# Patient Record
Sex: Female | Born: 1995 | Race: Black or African American | Hispanic: No | Marital: Single | Smoking: Current every day smoker
Health system: Southern US, Community
[De-identification: ages and names within clinical notes are randomized; demographics above are authoritative.]

## PROBLEM LIST (undated history)

## (undated) DIAGNOSIS — J45909 Unspecified asthma, uncomplicated: Secondary | ICD-10-CM

## (undated) DIAGNOSIS — E669 Obesity, unspecified: Secondary | ICD-10-CM

## (undated) HISTORY — PX: NO PAST SURGERIES: SHX2092

---
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
## 2015-06-21 ENCOUNTER — Emergency Department: Payer: Medicaid Other

## 2015-06-21 ENCOUNTER — Emergency Department
Admission: EM | Admit: 2015-06-21 | Discharge: 2015-06-21 | Disposition: A | Discharge status: Home / Self Care | Payer: Medicaid Other | Attending: Emergency Medicine | Admitting: Emergency Medicine

## 2015-06-21 ENCOUNTER — Encounter: Payer: Self-pay | Admitting: *Deleted

## 2015-06-21 DIAGNOSIS — T300 Burn of unspecified body region, unspecified degree: Secondary | ICD-10-CM

## 2015-06-21 DIAGNOSIS — T24111A Burn of first degree of right thigh, initial encounter: Secondary | ICD-10-CM | POA: Insufficient documentation

## 2015-06-21 DIAGNOSIS — T24101A Burn of first degree of unspecified site of right lower limb, except ankle and foot, initial encounter: Secondary | ICD-10-CM | POA: Diagnosis not present

## 2015-06-21 DIAGNOSIS — T2111XA Burn of first degree of chest wall, initial encounter: Secondary | ICD-10-CM | POA: Insufficient documentation

## 2015-06-21 DIAGNOSIS — Z3202 Encounter for pregnancy test, result negative: Secondary | ICD-10-CM | POA: Insufficient documentation

## 2015-06-21 DIAGNOSIS — S0003XA Contusion of scalp, initial encounter: Secondary | ICD-10-CM

## 2015-06-21 DIAGNOSIS — X100XXA Contact with hot drinks, initial encounter: Secondary | ICD-10-CM | POA: Diagnosis not present

## 2015-06-21 DIAGNOSIS — Y9289 Other specified places as the place of occurrence of the external cause: Secondary | ICD-10-CM | POA: Diagnosis not present

## 2015-06-21 DIAGNOSIS — Y9389 Activity, other specified: Secondary | ICD-10-CM | POA: Insufficient documentation

## 2015-06-21 DIAGNOSIS — T2027XA Burn of second degree of neck, initial encounter: Secondary | ICD-10-CM | POA: Diagnosis not present

## 2015-06-21 DIAGNOSIS — S0990XA Unspecified injury of head, initial encounter: Secondary | ICD-10-CM | POA: Diagnosis present

## 2015-06-21 DIAGNOSIS — T22151A Burn of first degree of right shoulder, initial encounter: Secondary | ICD-10-CM | POA: Insufficient documentation

## 2015-06-21 DIAGNOSIS — Y998 Other external cause status: Secondary | ICD-10-CM | POA: Insufficient documentation

## 2015-06-21 HISTORY — DX: Unspecified asthma, uncomplicated: J45.909

## 2015-06-21 LAB — POCT PREGNANCY, URINE: Preg Test, Ur: NEGATIVE

## 2015-06-21 MED ORDER — SILVER SULFADIAZINE 1 % EX CREA
TOPICAL_CREAM | Freq: Once | CUTANEOUS | Status: AC
  Administered 2015-06-21: 1 via TOPICAL

## 2015-06-21 MED ORDER — HYDROCODONE-ACETAMINOPHEN 5-325 MG PO TABS: 1.0000 | ORAL_TABLET | ORAL | Status: DC | PRN

## 2015-06-21 MED ORDER — SILVER SULFADIAZINE 1 % EX CREA
TOPICAL_CREAM | CUTANEOUS | Status: AC
  Administered 2015-06-21: 1 via TOPICAL
  Filled 2015-06-21: qty 85

## 2015-06-21 MED ORDER — HYDROCODONE-ACETAMINOPHEN 5-325 MG PO TABS
ORAL_TABLET | ORAL | Status: AC
  Administered 2015-06-21: 1 via ORAL
  Filled 2015-06-21: qty 1

## 2015-06-21 MED ORDER — HYDROCODONE-ACETAMINOPHEN 5-325 MG PO TABS
1.0000 | ORAL_TABLET | Freq: Once | ORAL | Status: AC
  Administered 2015-06-21: 1 via ORAL
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
## 2015-06-21 NOTE — ED Notes (Signed)
Pt discharged home after verbalizing understanding of discharge instructions; nad noted. 

## 2015-06-21 NOTE — Discharge Instructions (Signed)
Burn Care Burns hurt your skin. When your skin is hurt, it is easier to get an infection. Follow your doctor's directions to help prevent an infection. HOME CARE  Wash your hands well before you change your bandage.  Change your bandage as often as told by your doctor.  Remove the old bandage. If the bandage sticks, soak it off with cool, clean water.  Gently clean the burn with mild soap and water.  Pat the burn dry with a clean, dry cloth.  Put a thin layer of medicated cream on the burn.  Put a clean bandage on as told by your doctor.  Keep the bandage clean and dry.  Raise (elevate) the burn for the first 24 hours. After that, follow your doctor's directions.  Only take medicine as told by your doctor. GET HELP RIGHT AWAY IF:   You have too much pain.  The skin near the burn is red, tender, puffy (swollen), or has red streaks.  The burn area has yellowish white fluid (pus) or a bad smell coming from it.  You have a fever. MAKE SURE YOU:   Understand these instructions.  Will watch your condition.  Will get help right away if you are not doing well or get worse. Document Released: 09/16/2008 Document Revised: 03/01/2012 Document Reviewed: 04/30/2011 [facility] Patient Information 2015 [employer], [place]. This information is not intended to replace advice given to you by your health care provider. Make sure you discuss any questions you have with your health care provider.    RETURN TO ER IF ANY SIGNS OF INFECTION TAKE NORCO FOR PAIN AS DIRECTED
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
## 2015-10-22 ENCOUNTER — Other Ambulatory Visit: Payer: Self-pay | Admitting: Internal Medicine

## 2015-10-22 ENCOUNTER — Encounter: Payer: Self-pay | Admitting: Internal Medicine

## 2015-10-22 DIAGNOSIS — F602 Antisocial personality disorder: Secondary | ICD-10-CM | POA: Insufficient documentation

## 2015-10-22 DIAGNOSIS — M26629 Arthralgia of temporomandibular joint, unspecified side: Secondary | ICD-10-CM | POA: Insufficient documentation

## 2015-10-22 DIAGNOSIS — F331 Major depressive disorder, recurrent, moderate: Secondary | ICD-10-CM | POA: Insufficient documentation

## 2016-05-25 ENCOUNTER — Emergency Department
Admission: EM | Admit: 2016-05-25 | Discharge: 2016-05-26 | Disposition: A | Discharge status: Home / Self Care | Payer: Medicaid Other | Attending: Emergency Medicine | Admitting: Emergency Medicine

## 2016-05-25 ENCOUNTER — Encounter: Payer: Self-pay | Admitting: Emergency Medicine

## 2016-05-25 DIAGNOSIS — F17210 Nicotine dependence, cigarettes, uncomplicated: Secondary | ICD-10-CM | POA: Diagnosis not present

## 2016-05-25 DIAGNOSIS — T40601A Poisoning by unspecified narcotics, accidental (unintentional), initial encounter: Secondary | ICD-10-CM | POA: Diagnosis not present

## 2016-05-25 DIAGNOSIS — J45909 Unspecified asthma, uncomplicated: Secondary | ICD-10-CM | POA: Diagnosis not present

## 2016-05-25 DIAGNOSIS — T50901A Poisoning by unspecified drugs, medicaments and biological substances, accidental (unintentional), initial encounter: Secondary | ICD-10-CM

## 2016-05-25 DIAGNOSIS — R05 Cough: Secondary | ICD-10-CM | POA: Diagnosis present

## 2016-05-25 NOTE — ED Notes (Addendum)
Pt says she's had a productive cough since yesterday; headache; denies fever; pt says just pta she drank what she thought was a bottle of "non-alcoholic Robitussin"; pt unsteady on her feet, chatty and giggly in triage; pt says she presently cannot feel her face

## 2016-05-26 LAB — URINALYSIS COMPLETE WITH MICROSCOPIC (ARMC ONLY)
Bacteria, UA: NONE SEEN
GLUCOSE, UA: NEGATIVE mg/dL
Hgb urine dipstick: NEGATIVE
Leukocytes, UA: NEGATIVE
Nitrite: NEGATIVE
Protein, ur: 30 mg/dL — AB
Specific Gravity, Urine: 1.033 — ABNORMAL HIGH (ref 1.005–1.030)
pH: 5 (ref 5.0–8.0)

## 2016-05-26 LAB — COMPREHENSIVE METABOLIC PANEL
ALT: 13 U/L — ABNORMAL LOW (ref 14–54)
ANION GAP: 6 (ref 5–15)
AST: 18 U/L (ref 15–41)
Albumin: 4.5 g/dL (ref 3.5–5.0)
Alkaline Phosphatase: 57 U/L (ref 38–126)
BILIRUBIN TOTAL: 0.4 mg/dL (ref 0.3–1.2)
BUN: 7 mg/dL (ref 6–20)
CO2: 24 mmol/L (ref 22–32)
Calcium: 9.2 mg/dL (ref 8.9–10.3)
Chloride: 110 mmol/L (ref 101–111)
Creatinine, Ser: 0.76 mg/dL (ref 0.44–1.00)
GFR calc non Af Amer: >60 mL/min (ref >60)
GLUCOSE: 96 mg/dL (ref 65–99)
Potassium: 4 mmol/L (ref 3.5–5.1)
SODIUM: 140 mmol/L (ref 135–145)
TOTAL PROTEIN: 8.1 g/dL (ref 6.5–8.1)

## 2016-05-26 LAB — CBC WITH DIFFERENTIAL/PLATELET
Basophils Absolute: 0.1 10*3/uL (ref 0–0.1)
EOS ABS: 0.1 10*3/uL (ref 0–0.7)
Eosinophils Relative: 2 %
HEMATOCRIT: 41 % (ref 35.0–47.0)
Hemoglobin: 13.8 g/dL (ref 12.0–16.0)
Lymphocytes Relative: 15 %
Lymphs Abs: 1.1 10*3/uL (ref 1.0–3.6)
MCH: 31.1 pg (ref 26.0–34.0)
MCHC: 33.6 g/dL (ref 32.0–36.0)
MCV: 92.5 fL (ref 80.0–100.0)
MONO ABS: 0.7 10*3/uL (ref 0.2–0.9)
Neutro Abs: 5.3 10*3/uL (ref 1.4–6.5)
Platelets: 223 10*3/uL (ref 150–440)
RBC: 4.43 MIL/uL (ref 3.80–5.20)
RDW: 13.5 % (ref 11.5–14.5)
WBC: 7.2 10*3/uL (ref 3.6–11.0)

## 2016-05-26 LAB — SALICYLATE LEVEL: Salicylate Lvl: <4 mg/dL (ref 2.8–30.0)

## 2016-05-26 LAB — URINE DRUG SCREEN, QUALITATIVE (ARMC ONLY)
AMPHETAMINES, UR SCREEN: NOT DETECTED
Barbiturates, Ur Screen: NOT DETECTED
Benzodiazepine, Ur Scrn: NOT DETECTED
COCAINE METABOLITE, UR ~~LOC~~: NOT DETECTED
Cannabinoid 50 Ng, Ur ~~LOC~~: POSITIVE — AB
MDMA (ECSTASY) UR SCREEN: NOT DETECTED
METHADONE SCREEN, URINE: NOT DETECTED
Opiate, Ur Screen: POSITIVE — AB
PHENCYCLIDINE (PCP) UR S: POSITIVE — AB
Tricyclic, Ur Screen: NOT DETECTED

## 2016-05-26 LAB — ETHANOL

## 2016-05-26 LAB — ACETAMINOPHEN LEVEL
Acetaminophen (Tylenol), Serum: <10 ug/mL — ABNORMAL LOW (ref 10–30)
Acetaminophen (Tylenol), Serum: <10 ug/mL — ABNORMAL LOW (ref 10–30)

## 2016-05-26 LAB — POCT PREGNANCY, URINE: PREG TEST UR: NEGATIVE

## 2016-05-26 MED ORDER — LORAZEPAM 2 MG/ML IJ SOLN: 1.0000 mg | Freq: Once | INTRAMUSCULAR | Status: DC

## 2016-05-26 MED ORDER — LORAZEPAM 2 MG/ML IJ SOLN
1.0000 mg | Freq: Once | INTRAMUSCULAR | Status: AC
  Administered 2016-05-26: 1 mg via INTRAVENOUS
  Filled 2016-05-26: qty 1

## 2016-05-26 MED ORDER — SODIUM CHLORIDE 0.9 % IV BOLUS (SEPSIS)
1000.0000 mL | Freq: Once | INTRAVENOUS | Status: AC
  Administered 2016-05-26: 1000 mL via INTRAVENOUS

## 2016-05-26 NOTE — ED Provider Notes (Signed)
[facility] Emergency Department Provider Note   ____________________________________________  Time seen: Approximately 12:05 AM  I have reviewed the triage vital signs and the nursing notes.   HISTORY  Chief Complaint Headache and Cough    HPI Amber Stevens is a 20 y.o. female brought to the ED from work by a friend with a chief complaint of accidental overdose. Patient reports a productive cough since yesterday. States she celebrated her 20st birthday yesterday and is tired of being sick. While at work tonight, she drank an entire bottle of Robitussin. Denies intentional ingestion; states she just wanted to fix her cough. It is unclear what volume or type of Robitussin patient drank. She gestures describing a 4 ounce bottle of "nonalcoholic Robitussin". Also wanted her to be checked out secondary to patient being unsteady on her feet, acting drunk and bizarre. Denies recent fever, chills, chest pain, shortness of breath, abdominal pain, nausea, vomiting, diarrhea. Denies recent travel or trauma.   Past Medical History  Diagnosis Date  . Asthma     Patient Active Problem List   Diagnosis Date Noted  . Antisocial personality disorder 10/22/2015  . Moderate episode of recurrent major depressive disorder (HCC) 10/22/2015  . 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 No pertinent past surgical history.  No current outpatient prescriptions on file.  Allergies Review of patient's allergies indicates no known allergies.  Family History  Problem Relation Age of Onset  . CAD Father     Social History Social History  Substance Use Topics  . Smoking status: Current Every Day Smoker    Types: Cigarettes  . Smokeless tobacco: None  . Alcohol Use: No    Review of Systems  Constitutional: No fever/chills. Eyes: No visual changes. ENT: No sore throat. Cardiovascular: Denies chest pain. Respiratory:  Positive for cough.  Denies shortness of breath. Gastrointestinal: No abdominal pain.  No nausea, no vomiting.  No diarrhea.  No constipation. Genitourinary: Negative for dysuria. Musculoskeletal: Negative for back pain. Skin: Negative for rash. Neurological: Negative for headaches, focal weakness or numbness. Psychiatric:Positive for bizarre behavior and hallucinations.  10-point ROS otherwise negative.  ____________________________________________   PHYSICAL EXAM:  VITAL SIGNS: ED Triage Vitals  Enc Vitals Group     BP 05/25/16 2322 133/84 mmHg     Pulse Rate 05/25/16 2322 99     Resp 05/25/16 2322 18     Temp 05/25/16 2322 98.7 F (37.1 C)     Temp Source 05/25/16 2322 Oral     SpO2 05/25/16 2322 97 %     Weight 05/25/16 2322 280 lb (127.007 kg)     Height 05/25/16 2322 5\' 2"  (1.575 m)     Head Cir --      Peak Flow --      Pain Score 05/25/16 2338 0     Pain Loc --      Pain Edu? --      Excl. in GC? --     Constitutional: Alert and oriented. Well appearing and in mild acute distress. Feeling hot. Eyes: Conjunctivae are normal. PERRL. EOMI. Head: Atraumatic. Nose: No congestion/rhinnorhea. Mouth/Throat: Mucous membranes are moist.  Oropharynx non-erythematous. Neck: No stridor.  No cervical spine tenderness to palpation.  Supple neck without meningismus. Cardiovascular: Normal rate, regular rhythm. Grossly normal heart sounds.  Good peripheral circulation. Respiratory: Normal respiratory effort.  No retractions. Lungs CTAB. Gastrointestinal: Soft and nontender. No distention. No abdominal bruits. No CVA tenderness. Musculoskeletal: No lower  extremity tenderness nor edema.  No joint effusions. Neurologic:  Normal speech and language. No gross focal neurologic deficits are appreciated. MAEx4. Giddy and chatty. Skin:  Skin is warm, dry and intact. No rash noted. Psychiatric: Mood and affect are  bizarre Speech and behavior are bizarre. Seems to be responding to internal stimuli, looking  around the room, appears to be seeing things on the wall.  ____________________________________________   LABS (all labs ordered are listed, but only abnormal results are displayed)  Labs Reviewed  COMPREHENSIVE METABOLIC PANEL - Abnormal; Notable for the following:    ALT 13 (*)    All other components within normal limits  ACETAMINOPHEN LEVEL - Abnormal; Notable for the following:    Acetaminophen (Tylenol), Serum <10 (*)    All other components within normal limits  URINALYSIS COMPLETEWITH MICROSCOPIC ([facility] ONLY) - Abnormal; Notable for the following:    Color, Urine YELLOW (*)    APPearance CLEAR (*)    Bilirubin Urine 2+ (*)    Ketones, ur TRACE (*)    Specific Gravity, Urine 1.033 (*)    Protein, ur 30 (*)    Squamous Epithelial / LPF 0-5 (*)    All other components within normal limits  URINE DRUG SCREEN, QUALITATIVE ([facility] ONLY) - Abnormal; Notable for the following:    Opiate, Ur Screen POSITIVE (*)    Phencyclidine (PCP) Ur S POSITIVE (*)    Cannabinoid 50 Ng, Ur [place] POSITIVE (*)    All other components within normal limits  ACETAMINOPHEN LEVEL - Abnormal; Notable for the following:    Acetaminophen (Tylenol), Serum <10 (*)    All other components within normal limits  CBC WITH DIFFERENTIAL/PLATELET  ETHANOL  SALICYLATE LEVEL  SALICYLATE LEVEL  BLOOD GAS, ARTERIAL  POC URINE PREG, ED  POCT PREGNANCY, URINE   ____________________________________________  EKG  ED ECG REPORT I, Lekita Kerekes J, the attending physician, personally viewed and interpreted this ECG.   Date: 05/26/2016  EKG Time: 0005  Rate: 91  Rhythm: normal EKG, normal sinus rhythm  Axis: Normal  Intervals:none  ST&T Change: Nonspecific  ____________________________________________  RADIOLOGY  None ____________________________________________   PROCEDURES  Procedure(s) performed: None  Critical Care performed: No  ____________________________________________   INITIAL IMPRESSION  / ASSESSMENT AND PLAN / ED COURSE  Pertinent labs & imaging results that were available during my care of the patient were reviewed by me and considered in my medical decision making (see chart for details).  20 year old female who presents with accidental overdose of Robitussin. She is unsteady on her feet, giddy, hallucinating with bizarre behavior. Patient was placed under involuntary commitment secondary to her trying to leave the premises. Will obtain screening lab work including toxicological screen, initiate IV fluid resuscitation, Ativan for sedation. Nursing spoke with poison control who recommends repeat 4 hour Tylenol and salicylates as well as a minimum of 6 hour observation.  ----------------------------------------- 1:14 AM on 05/26/2016 -----------------------------------------  Coworker brought in what patient took. Tussin DM which contains dextromethorphan 20 mg and guaifenesin 200 mg in a 4 fluid ounce bottle.  ----------------------------------------- 3:45 AM on 05/26/2016 -----------------------------------------  Patient is resting in no acute distress. Normotensive and not tachycardic.  ----------------------------------------- 5:28 AM on 05/26/2016 -----------------------------------------  Patient is resting comfortably. Speech is less pressured. She is alert and oriented 3. Denies intentional ingestion. Denies depression, SI/HI/AH/VH. Educated patient on taking only the recommended amount of over-the-counter medicines. Strict return precautions given. Patient verbalizes understanding and agrees with plan of care. Will resend IVC and discharge  patient to home with responsible adult. ____________________________________________   FINAL CLINICAL IMPRESSION(S) / ED DIAGNOSES  Final diagnoses:  Accidental overdose, initial encounter      NEW MEDICATIONS STARTED DURING THIS VISIT:  New Prescriptions   No medications on file     Note:  This document was  prepared using Dragon voice recognition software and may include unintentional dictation errors.    Irean Hong, MD 05/26/16 [id number]

## 2016-05-26 NOTE — ED Notes (Signed)
Pt. States "I got bronchitis, I have gotten it before".  Pt. States "I needed something for my congestion and cough".  Pt. Stated she took some of her grandmothers medicine, pt. Unsure of what it was.  Pt. Stated she took two swallows of (Adult Tussin DM), then stated "I didn't think it was working so I drank the rest of 4 oz bottle.

## 2016-05-26 NOTE — ED Notes (Signed)
Poison control called to get lab values, EKG results and pt. Vitals.

## 2016-05-26 NOTE — ED Notes (Signed)
Pt. Friend left phone # to be called when pt. [place] [phone]

## 2016-05-26 NOTE — ED Notes (Signed)
Pt. Restless in room.  Pt. Does not want to stay in the bed, pt. Repeatedly has to be told to either stay in bed or sit on bench and to use call bell if she needs to get up.

## 2016-05-26 NOTE — ED Notes (Signed)
Pt. Going home with friend 

## 2016-05-26 NOTE — ED Notes (Signed)
Went into room to introduce self, pt. On phone.

## 2016-05-26 NOTE — Progress Notes (Signed)
Pt is refusing to allow me to stick for an ABG. Encouraged pt to allow stick and gave pt time to think about it. Pt continues to refuse to allow me to stick her. Dr. Dolores FrameSung notified.

## 2016-05-26 NOTE — Discharge Instructions (Signed)
Do not take more than the recommended amount of over-the-counter medication. Return to the ER for worsening symptoms, persistent vomiting, difficulty breathing or other concerns.  Accidental Overdose A drug overdose occurs when a chemical substance (drug or medication) is used in amounts large enough to overcome a person. This may result in severe illness or death. This is a type of poisoning. Accidental overdoses of medications or other substances come from a variety of reasons. When this happens accidentally, it is often because the person taking the substance does not know enough about what they have taken. Drugs which commonly cause overdose deaths are alcohol, psychotropic medications (medications which affect the mind), pain medications, illegal drugs (street drugs) such as cocaine and heroin, and multiple drugs taken at the same time. It may result from careless behavior (such as over-indulging at a party). Other causes of overdose may include multiple drug use, a lapse in memory, or drug use after a period of no drug use.  Sometimes overdosing occurs because a person cannot remember if they have taken their medication.  A common unintentional overdose in young children involves multi-vitamins containing iron. Iron is a part of the hemoglobin molecule in blood. It is used to transport oxygen to living cells. When taken in small amounts, iron allows the body to restock hemoglobin. In large amounts, it causes problems in the body. If this overdose is not treated, it can lead to death. Never take medicines that show signs of tampering or do not seem quite right. Never take medicines in the dark or in poor lighting. Read the label and check each dose of medicine before you take it. When adults are poisoned, it happens most often through carelessness or lack of information. Taking medicines in the dark or taking medicine prescribed for someone else to treat the same type of problem is a dangerous  practice. SYMPTOMS  Symptoms of overdose depend on the medication and amount taken. They can vary from over-activity with stimulant over-dosage, to sleepiness from depressants such as alcohol, narcotics and tranquilizers. Confusion, dizziness, nausea and vomiting may be present. If problems are severe enough coma and death may result. DIAGNOSIS  Diagnosis and management are generally straightforward if the drug is known. Otherwise it is more difficult. At times, certain symptoms and signs exhibited by the patient, or blood tests, can reveal the drug in question.  TREATMENT  In an emergency department, most patients can be treated with supportive measures. Antidotes may be available if there has been an overdose of opioids or benzodiazepines. A rapid improvement will often occur if this is the cause of overdose. At home or away from medical care:  There may be no immediate problems or warning signs in children.  Not everything works well in all cases of poisoning.  Take immediate action. Poisons may act quickly.  If you think someone has swallowed medicine or a household product, and the person is unconscious, having seizures (convulsions), or is not breathing, immediately call for an ambulance. IF a person is conscious and appears to be doing OK but has swallowed a poison:  Do not wait to see what effect the poison will have. Immediately call a poison control center (listed in the white pages of your telephone book under "Poison Control" or inside the front cover with other emergency numbers). Some poison control centers have TTY capability for the deaf. Check with your local center if you or someone in your family requires this service.  Keep the container so you can  read the label on the product for ingredients.  Describe what, when, and how much was taken and the age and condition of the person poisoned. Inform them if the person is vomiting, choking, drowsy, shows a change in color or  temperature of skin, is conscious or unconscious, or is convulsing.  Do not cause vomiting unless instructed by medical personnel. Do not induce vomiting or force liquids into a person who is convulsing, unconscious, or very drowsy. Stay calm and in control.   Activated charcoal also is sometimes used in certain types of poisoning and you may wish to add a supply to your emergency medicines. It is available without a prescription. Call a poison control center before using this medication. PREVENTION  Thousands of children die every year from unintentional poisoning. This may be from household chemicals, poisoning from carbon monoxide in a car, taking their parent's medications, or simply taking a few iron pills or vitamins with iron. Poisoning comes from unexpected sources.  Store medicines out of the sight and reach of children, preferably in a locked cabinet. Do not keep medications in a food cabinet. Always store your medicines in a secure place. Get rid of expired medications.  If you have children living with you or have them as occasional guests, you should have child-resistant caps on your medicine containers. Keep everything out of reach. Child proof your home.  If you are called to the telephone or to answer the door while you are taking a medicine, take the container with you or put the medicine out of the reach of small children.  Do not take your medication in front of children. Do not tell your child how good a medication is and how good it is for them. They may get the idea it is more of a treat.  If you are an adult and have accidentally taken an overdose, you need to consider how this happened and what can be done to prevent it from happening again. If this was from a street drug or alcohol, determine if there is a problem that needs addressing. If you are not sure a problems exists, it is easy to talk to a professional and ask them if they think you have a problem. It is better to  handle this problem in this way before it happens again and has a much worse consequence.   This information is not intended to replace advice given to you by your health care provider. Make sure you discuss any questions you have with your health care provider.   Document Released: 02/21/2005 Document Revised: 12/29/2014 Document Reviewed: 05/28/2015 Elsevier Interactive Patient Education [employer]

## 2018-04-05 ENCOUNTER — Encounter: Payer: Self-pay | Admitting: Emergency Medicine

## 2018-04-05 DIAGNOSIS — Z5321 Procedure and treatment not carried out due to patient leaving prior to being seen by health care provider: Secondary | ICD-10-CM | POA: Insufficient documentation

## 2018-04-05 DIAGNOSIS — R1030 Lower abdominal pain, unspecified: Secondary | ICD-10-CM | POA: Insufficient documentation

## 2018-04-05 NOTE — ED Notes (Signed)
Pt refusing to have blood drawn ans sts, "I just cant do needles, man. naw fuck that." Pt unable to provide urine as well.

## 2018-04-05 NOTE — ED Triage Notes (Signed)
Pt ambulatory to triage with steady gait, no distress noted. Pt c/o lower abdominal pain after consuming a coffee at work without eating food today. Pt sts she was okay before she had the coffee.

## 2018-04-06 ENCOUNTER — Emergency Department
Admission: EM | Admit: 2018-04-06 | Discharge: 2018-04-06 | Disposition: A | Discharge status: Home / Self Care | Payer: Self-pay | Attending: Emergency Medicine | Admitting: Emergency Medicine

## 2018-04-06 ENCOUNTER — Telehealth: Payer: Self-pay | Admitting: Emergency Medicine

## 2018-04-06 NOTE — ED Notes (Signed)
No answer when called from lobby 

## 2018-04-06 NOTE — Telephone Encounter (Signed)
Called patient due to lwot to inquire about condition and follow up plans. No answer and no voicemail available. 

## 2020-04-23 ENCOUNTER — Ambulatory Visit
Admission: EM | Admit: 2020-04-23 | Discharge: 2020-04-23 | Discharge status: Against Medical Advice | Payer: Self-pay | Attending: Family Medicine | Admitting: Family Medicine

## 2020-04-23 ENCOUNTER — Encounter: Payer: Self-pay | Admitting: Emergency Medicine

## 2020-04-23 ENCOUNTER — Ambulatory Visit (INDEPENDENT_AMBULATORY_CARE_PROVIDER_SITE_OTHER): Payer: Self-pay

## 2020-04-23 ENCOUNTER — Other Ambulatory Visit: Payer: Self-pay

## 2020-04-23 DIAGNOSIS — M79605 Pain in left leg: Secondary | ICD-10-CM

## 2020-04-23 DIAGNOSIS — R200 Anesthesia of skin: Secondary | ICD-10-CM | POA: Insufficient documentation

## 2020-04-23 HISTORY — DX: Obesity, unspecified: E66.9

## 2020-04-23 LAB — PREGNANCY, URINE: Preg Test, Ur: NEGATIVE

## 2020-04-23 NOTE — ED Provider Notes (Addendum)
[facility]    CSN: [id number] Arrival date & time: 04/23/20  1345      History   Chief Complaint Chief Complaint  Patient presents with  . Leg Swelling    left     HPI Amber Stevens is a 24 y.o. female.   Patient is a 24 year old female with asthma and morbid obesity who presents with complaint of pain and swelling to her left thigh.  Patient states about 1 month ago she had an insect bite on the lateral portion of her left thigh.  She states the immediate bite has resolved but she has residual pain as well as skin numbness.  Patient reports pain whenever she hits her leg on a door or for dog Jumped up on Her Hit That Area of Her Leg.  So She Reports the Skin Numbness/Decreased Sensation in That Area.  She has taken Tylenol for It.  She denies fevers or chills      Past Medical History:  Diagnosis Date  . Asthma   . Obesity     Patient Active Problem List   Diagnosis Date Noted  . Antisocial personality disorder (HCC) 10/22/2015  . Moderate episode of recurrent major depressive disorder (HCC) 10/22/2015  . 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 NO PAST SURGERIES      OB History   No obstetric history on file.      Home Medications    Prior to Admission medications   Not on File    Family History Family History  Problem Relation Age of Onset  . CAD Father   . Kidney disease Father   . Diabetes Father   . Healthy Mother     Social History Social History   Tobacco Use  . Smoking status: Current Every Day Smoker    Packs/day: 1.00    Years: 10.00    Pack years: 10.00    Types: Cigarettes  . Smokeless tobacco: Never Used  Substance Use Topics  . Alcohol use: No    Alcohol/week: 0.0 standard drinks  . Drug use: Never     Allergies   Cherry flavor   Review of Systems Review of Systems as noted above in HPI.  Other systems reviewed and found to be  negative   Physical Exam Triage Vital Signs ED Triage Vitals  Enc Vitals Group     BP 04/23/20 1410 105/89     Pulse Rate 04/23/20 1410 100     Resp 04/23/20 1410 18     Temp 04/23/20 1410 98.4 F (36.9 C)     Temp Source 04/23/20 1410 Oral     SpO2 04/23/20 1410 98 %     Weight 04/23/20 1410 288 lb 14.4 oz (131 kg)     Height 04/23/20 1410 5\' 2"  (1.575 m)     Head Circumference --      Peak Flow --      Pain Score 04/23/20 1409 3     Pain Loc --      Pain Edu? --      Excl. in GC? --    No data found.  Updated Vital Signs BP 105/89 (BP Location: Left Arm)   Pulse 100   Temp 98.4 F (36.9 C) (Oral)   Resp 18   Ht 5\' 2"  (1.575 m)   Wt 288 lb 14.4 oz (131 kg)   LMP 04/09/2020 (Approximate)   SpO2 98%  BMI 52.84 kg/m    Physical Exam Constitutional:      Appearance: Normal appearance. She is obese. She is not ill-appearing.  Pulmonary:     Effort: Pulmonary effort is normal.  Musculoskeletal:        General: Normal range of motion.     Right upper leg: Normal.       Legs:  Neurological:     Mental Status: She is alert.      UC Treatments / Results  Labs (all labs ordered are listed, but only abnormal results are displayed) Labs Reviewed  PREGNANCY, URINE    EKG   Radiology DG Femur Min 2 Views Left  Result Date: 04/23/2020 CLINICAL DATA:  Pain. EXAM: LEFT FEMUR 2 VIEWS COMPARISON:  None. FINDINGS: There is no evidence of fracture or other focal bone lesions. Soft tissues are unremarkable. IMPRESSION: Negative. Electronically Signed   By: Constance Holster M.D.   On: 04/23/2020 15:40    Procedures Procedures (including critical care time)  Medications Ordered in UC Medications - No data to display  Initial Impression / Assessment and Plan / UC Course  I have reviewed the triage vital signs and the nursing notes.  Pertinent labs & imaging results that were available during my care of the patient were reviewed by me and considered in my  medical decision making (see chart for details).     No obvious injury or lesion noted to exam.  Will check x-ray to rule out any foreign body or deeper tissue inflammation.  Some insects can cause area hypersensitivity and bites area anesthesia but unsure if this would last of this far out.  Final Clinical Impressions(s) / UC Diagnoses   Final diagnoses:  Pain in lateral left lower extremity  Anesthesia of skin     Discharge Instructions     -X-ray negative for any acute injury or signs of infection, or tissue lesion. -Can use ibuprofen or Tylenol as needed for pain -If symptoms continue can follow-up with primary care provider.-    ED Prescriptions    None     PDMP not reviewed this encounter.   Luvenia Redden, PA-C 04/23/20 1548  Addendum: Patient left the clinic prior to providing her with discharge information.  Today contacted by nursing staff by phone with no answer.    Luvenia Redden, PA-C 04/23/20 1615

## 2020-04-23 NOTE — Discharge Instructions (Addendum)
-  X-ray negative for any acute injury or signs of infection, or tissue lesion. -Can use ibuprofen or Tylenol as needed for pain -If symptoms continue can follow-up with primary care provider.-

## 2020-04-23 NOTE — ED Triage Notes (Signed)
Patient in today c/o left thigh swelling and pain x 1 month. Patient states she had a bug bite 1 month ago, which resolved, but patient states the pain and swelling has not gone away.

## 2021-05-09 ENCOUNTER — Emergency Department
Admission: EM | Admit: 2021-05-09 | Discharge: 2021-05-09 | Disposition: A | Discharge status: Home / Self Care | Payer: Self-pay | Attending: Emergency Medicine | Admitting: Emergency Medicine

## 2021-05-09 ENCOUNTER — Other Ambulatory Visit: Payer: Self-pay

## 2021-05-09 ENCOUNTER — Encounter: Payer: Self-pay | Admitting: Emergency Medicine

## 2021-05-09 DIAGNOSIS — S5012XA Contusion of left forearm, initial encounter: Secondary | ICD-10-CM | POA: Insufficient documentation

## 2021-05-09 DIAGNOSIS — X58XXXA Exposure to other specified factors, initial encounter: Secondary | ICD-10-CM | POA: Insufficient documentation

## 2021-05-09 DIAGNOSIS — I809 Phlebitis and thrombophlebitis of unspecified site: Secondary | ICD-10-CM | POA: Insufficient documentation

## 2021-05-09 DIAGNOSIS — F17210 Nicotine dependence, cigarettes, uncomplicated: Secondary | ICD-10-CM | POA: Insufficient documentation

## 2021-05-09 DIAGNOSIS — J45909 Unspecified asthma, uncomplicated: Secondary | ICD-10-CM | POA: Insufficient documentation

## 2021-05-09 MED ORDER — MELOXICAM 15 MG PO TABS: 15.0000 mg | ORAL_TABLET | Freq: Every day | ORAL | 0 refills | Status: AC

## 2021-05-09 NOTE — ED Provider Notes (Signed)
[facility] Emergency Department Provider Note  ____________________________________________  Time seen: Approximately 5:57 PM  I have reviewed the triage vital signs and the nursing notes.   HISTORY  Chief Complaint Bruise    HPI Amber Stevens is a 25 y.o. female who presents the emergency department complaining of a bruise and a "knot" to her forearm.  Patient states that she does not remember any kind of trauma to her forearm.  Area is relatively nonpainful unless you are pushing over the night.  Patient denies any swelling of the forearm.  No erythema.  No history of bleeding or clotting disorders.  Patient does not take any medication for same.  No other complaints at this time.         Past Medical History:  Diagnosis Date  . Asthma   . Obesity     Patient Active Problem List   Diagnosis Date Noted  . Antisocial personality disorder (HCC) 10/22/2015  . Moderate episode of recurrent major depressive disorder (HCC) 10/22/2015  . 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 NO PAST SURGERIES      Prior to Admission medications   Medication Sig Start Date End Date Taking? Authorizing Provider  meloxicam (MOBIC) 15 MG tablet Take 1 tablet (15 mg total) by mouth daily. 05/09/21  Yes Ahriana Gunkel, Delorise Royals, PA-C    Allergies Cherry flavor  Family History  Problem Relation Age of Onset  . CAD Father   . Kidney disease Father   . Diabetes Father   . Healthy Mother     Social History Social History   Tobacco Use  . Smoking status: Current Every Day Smoker    Packs/day: 1.00    Years: 10.00    Pack years: 10.00    Types: Cigarettes  . Smokeless tobacco: Never Used  Vaping Use  . Vaping Use: Never used  Substance Use Topics  . Alcohol use: No    Alcohol/week: 0.0 standard drinks  . Drug use: Never     Review of Systems  Constitutional: No fever/chills Eyes: No visual  changes. No discharge ENT: No upper respiratory complaints. Cardiovascular: no chest pain. Respiratory: no cough. No SOB. Gastrointestinal: No abdominal pain.  No nausea, no vomiting.  No diarrhea.  No constipation. Musculoskeletal: Bruise with "knot" to the left forearm Skin: Negative for rash, abrasions, lacerations, ecchymosis. Neurological: Negative for headaches, focal weakness or numbness.  10 System ROS otherwise negative.  ____________________________________________   PHYSICAL EXAM:  VITAL SIGNS: ED Triage Vitals  Enc Vitals Group     BP 05/09/21 1623 124/71     Pulse Rate 05/09/21 1623 99     Resp 05/09/21 1623 20     Temp 05/09/21 1623 98.9 F (37.2 C)     Temp Source 05/09/21 1623 Oral     SpO2 05/09/21 1623 100 %     Weight 05/09/21 1622 288 lb 12.8 oz (131 kg)     Height 05/09/21 1622 5\' 2"  (1.575 m)     Head Circumference --      Peak Flow --      Pain Score 05/09/21 1622 3     Pain Loc --      Pain Edu? --      Excl. in GC? --      Constitutional: Alert and oriented. Well appearing and in no acute distress. Eyes: Conjunctivae are normal. PERRL. EOMI. Head: Atraumatic. ENT:  Ears:       Nose: No congestion/rhinnorhea.      Mouth/Throat: Mucous membranes are moist.  Neck: No stridor.    Cardiovascular: Normal rate, regular rhythm. Normal S1 and S2.  Good peripheral circulation. Respiratory: Normal respiratory effort without tachypnea or retractions. Lungs CTAB. Good air entry to the bases with no decreased or absent breath sounds. Musculoskeletal: Full range of motion to all extremities. No gross deformities appreciated.  Visualization of the left forearm reveals an area of ecchymosis to the anterior forearm measuring approximately 2 cm in diameter.  There is a superficial firm area consistent with phlebitis underlying this area.  No warmth.  No erythema.  Forearm is nonedematous. Neurologic:  Normal speech and language. No gross focal neurologic  deficits are appreciated.  Skin:  Skin is warm, dry and intact. No rash noted. Psychiatric: Mood and affect are normal. Speech and behavior are normal. Patient exhibits appropriate insight and judgement.   ____________________________________________   LABS (all labs ordered are listed, but only abnormal results are displayed)  Labs Reviewed - No data to display ____________________________________________  EKG   ____________________________________________  RADIOLOGY   No results found.  ____________________________________________    PROCEDURES  Procedure(s) performed:    Procedures    Medications - No data to display   ____________________________________________   INITIAL IMPRESSION / ASSESSMENT AND PLAN / ED COURSE  Pertinent labs & imaging results that were available during my care of the patient were reviewed by me and considered in my medical decision making (see chart for details).  Review of the [place] CSRS was performed in accordance of the [facility] prior to dispensing any controlled drugs.           Patient's diagnosis is consistent with phlebitis.  Patient presented to the emergency department with a "knot" to the forearm.  Findings were consistent with phlebitis.  Appears that patient had a small area of ecchymosis with underlying phlebitis.  No edema, erythema to the forearm.  No bleeding or clotting disorders.  Patient will be treated with anti-inflammatory and warm compress.  Concerning signs and symptoms are discussed with the patient for return to the emergency department for further evaluation.  Differential included bug bite, cellulitis, phlebitis, DVT.  No indication for labs or imaging currently.  Follow-up primary care as needed..  Patient is given ED precautions to return to the ED for any worsening or new symptoms.     ____________________________________________  FINAL CLINICAL IMPRESSION(S) / ED DIAGNOSES  Final diagnoses:  Phlebitis       NEW MEDICATIONS STARTED DURING THIS VISIT:  ED Discharge Orders         Ordered    meloxicam (MOBIC) 15 MG tablet  Daily        05/09/21 1808              This chart was dictated using voice recognition software/Dragon. Despite best efforts to proofread, errors can occur which can change the meaning. Any change was purely unintentional.    Racheal Patches, PA-C 05/09/21 Mallie Snooks    Shaune Pollack, MD 05/14/21 [phone]

## 2021-05-09 NOTE — ED Notes (Signed)
See triage note  Presents with bruise to left f/a  Denies any injury

## 2021-05-09 NOTE — ED Triage Notes (Signed)
Pt reports got ready to go to bed last pm and saw a bruise on her left forearm. Pt states that she hasn't hit anything so not sure how it got there

## 2021-05-21 ENCOUNTER — Emergency Department
Admission: EM | Admit: 2021-05-21 | Discharge: 2021-05-21 | Disposition: A | Discharge status: Home / Self Care | Payer: Self-pay | Attending: Emergency Medicine | Admitting: Emergency Medicine

## 2021-05-21 ENCOUNTER — Encounter: Payer: Self-pay | Admitting: Emergency Medicine

## 2021-05-21 ENCOUNTER — Other Ambulatory Visit: Payer: Self-pay

## 2021-05-21 DIAGNOSIS — J45909 Unspecified asthma, uncomplicated: Secondary | ICD-10-CM | POA: Insufficient documentation

## 2021-05-21 DIAGNOSIS — F17210 Nicotine dependence, cigarettes, uncomplicated: Secondary | ICD-10-CM | POA: Insufficient documentation

## 2021-05-21 DIAGNOSIS — H60501 Unspecified acute noninfective otitis externa, right ear: Secondary | ICD-10-CM | POA: Insufficient documentation

## 2021-05-21 MED ORDER — OFLOXACIN 0.3 % OP SOLN
5.0000 [drp] | Freq: Every day | OPHTHALMIC | Status: DC
  Administered 2021-05-21: 5 [drp] via OTIC
  Filled 2021-05-21 (×2): qty 5

## 2021-05-21 NOTE — ED Provider Notes (Signed)
[facility] Emergency Department Provider Note   ____________________________________________    I have reviewed the triage vital signs and the nursing notes.   HISTORY  Chief Complaint Ear Pain     HPI Amber Stevens is a 24 y.o. female who complains of right-sided ear pain for about 24 hours.  She feels like there is something in her ear.  Did have some nasal congestion as well yesterday.  No fevers reported.  She is nondiabetic.  Past Medical History:  Diagnosis Date  . Asthma   . Obesity     Patient Active Problem List   Diagnosis Date Noted  . Antisocial personality disorder (HCC) 10/22/2015  . Moderate episode of recurrent major depressive disorder (HCC) 10/22/2015  . 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 NO PAST SURGERIES      Prior to Admission medications   Medication Sig Start Date End Date Taking? Authorizing Provider  meloxicam (MOBIC) 15 MG tablet Take 1 tablet (15 mg total) by mouth daily. 05/09/21   Cuthriell, Delorise Royals, PA-C     Allergies Cherry flavor  Family History  Problem Relation Age of Onset  . CAD Father   . Kidney disease Father   . Diabetes Father   . Healthy Mother     Social History Social History   Tobacco Use  . Smoking status: Current Every Day Smoker    Packs/day: 1.00    Years: 10.00    Pack years: 10.00    Types: Cigarettes  . Smokeless tobacco: Never Used  Vaping Use  . Vaping Use: Never used  Substance Use Topics  . Alcohol use: No    Alcohol/week: 0.0 standard drinks  . Drug use: Never    Review of Systems  Constitutional: No fever/chills  ENT: As above    Skin: Negative for rash. Neurological: Negative for headaches     ____________________________________________   PHYSICAL EXAM:  VITAL SIGNS: ED Triage Vitals  Enc Vitals Group     BP 05/21/21 1904 130/70     Pulse Rate 05/21/21 1904 93      Resp 05/21/21 1904 18     Temp 05/21/21 1904 98.6 F (37 C)     Temp Source 05/21/21 1904 Oral     SpO2 05/21/21 1904 100 %     Weight 05/21/21 1848 131 kg (288 lb 12.8 oz)     Height 05/21/21 1848 1.575 m (5\' 2" )     Head Circumference --      Peak Flow --      Pain Score 05/21/21 1848 6     Pain Loc --      Pain Edu? --      Excl. in GC? --      Constitutional: Alert and oriented. No acute distress. Pleasant and interactive Eyes: Conjunctivae are normal.  Head: Atraumatic. Nose: No congestion/rhinnorhea. Ears: Right ear erythema consistent with otitis externa, left ear normal Mouth/Throat: Mucous membranes are moist.   Cardiovascular: Normal rate, regular rhythm.  Respiratory: Normal respiratory effort.  No retractions.   Neurologic:  Normal speech and language. No gross focal neurologic deficits are appreciated.   Skin:  Skin is warm, dry and intact. No rash noted.   ____________________________________________   LABS (all labs ordered are listed, but only abnormal results are displayed)  Labs Reviewed - No data to display ____________________________________________  EKG   ____________________________________________  RADIOLOGY  None ____________________________________________  PROCEDURES  Procedure(s) performed: No  Procedures   Critical Care performed: No ____________________________________________   INITIAL IMPRESSION / ASSESSMENT AND PLAN / ED COURSE  Pertinent labs & imaging results that were available during my care of the patient were reviewed by me and considered in my medical decision making (see chart for details).  Treated with ofloxacin, 5 drops twice daily x5 days outpatient follow-up as needed   ____________________________________________   FINAL CLINICAL IMPRESSION(S) / ED DIAGNOSES  Final diagnoses:  Acute otitis externa of right ear, unspecified type      NEW MEDICATIONS STARTED DURING THIS VISIT:  Discharge  Medication List as of 05/21/2021  8:04 PM       Note:  This document was prepared using Dragon voice recognition software and may include unintentional dictation errors.   Jene Every, MD 05/21/21 2037

## 2021-05-21 NOTE — ED Triage Notes (Signed)
C/O right ear pain and muffled since today.  AAOx3.  Skin warm and dry. NAD

## 2021-11-02 ENCOUNTER — Emergency Department
Admission: EM | Admit: 2021-11-02 | Discharge: 2021-11-02 | Discharge status: Against Medical Advice | Payer: Self-pay | Source: Home / Self Care

## 2021-11-07 ENCOUNTER — Emergency Department
Admission: EM | Admit: 2021-11-07 | Discharge: 2021-11-07 | Disposition: A | Discharge status: Home / Self Care | Payer: Self-pay | Attending: Emergency Medicine | Admitting: Emergency Medicine

## 2021-11-07 ENCOUNTER — Other Ambulatory Visit: Payer: Self-pay

## 2021-11-07 ENCOUNTER — Emergency Department: Payer: Self-pay

## 2021-11-07 DIAGNOSIS — Z20822 Contact with and (suspected) exposure to covid-19: Secondary | ICD-10-CM | POA: Insufficient documentation

## 2021-11-07 DIAGNOSIS — F17210 Nicotine dependence, cigarettes, uncomplicated: Secondary | ICD-10-CM | POA: Insufficient documentation

## 2021-11-07 DIAGNOSIS — J40 Bronchitis, not specified as acute or chronic: Secondary | ICD-10-CM | POA: Insufficient documentation

## 2021-11-07 LAB — RESP PANEL BY RT-PCR (FLU A&B, COVID) ARPGX2
Influenza A by PCR: NEGATIVE
Influenza B by PCR: NEGATIVE
SARS Coronavirus 2 by RT PCR: NEGATIVE

## 2021-11-07 MED ORDER — ALBUTEROL SULFATE HFA 108 (90 BASE) MCG/ACT IN AERS: 2.0000 | INHALATION_SPRAY | Freq: Four times a day (QID) | RESPIRATORY_TRACT | 0 refills | Status: DC | PRN

## 2021-11-07 MED ORDER — PREDNISONE 20 MG PO TABS: 20.0000 mg | ORAL_TABLET | Freq: Two times a day (BID) | ORAL | 0 refills | Status: AC

## 2021-11-07 NOTE — Discharge Instructions (Addendum)
Take the meds as directed

## 2021-11-07 NOTE — ED Triage Notes (Signed)
Pt presents to ED with c/o of chest congestion, cough, and headache and cold like symptoms for 1 week. Pt denies fevers or chills. NAD noted.

## 2021-11-07 NOTE — ED Provider Notes (Signed)
Emergency Medicine Provider Triage Evaluation Note  Amber Stevens, a 25 y.o. female  was evaluated in triage.  Pt complains of cough and congestion. She denies any FCS. She endorses cold symptoms for the last week.   Review of Systems  Positive: cough Negative: FCS  Physical Exam  There were no vitals taken for this visit. Gen:   Awake, no distress  NAD Resp:  Normal effort expiratory wheezes and rhonchi noted MSK:   Moves extremities without difficulty  Other:  CVS: RRR  Medical Decision Making  Medically screening exam initiated at 11:46 AM.  Appropriate orders placed.  Amber Stevens was informed that the remainder of the evaluation will be completed by another provider, this initial triage assessment does not replace that evaluation, and the importance of remaining in the ED until their evaluation is complete.  Patient with ED evaluation of cough and congestion.    Lissa Hoard, PA-C 11/07/21 1147    Merwyn Katos, MD 11/07/21 [phone]

## 2021-11-07 NOTE — ED Triage Notes (Signed)
Pt to ED with c/o of chest congestion and cough. She is able to speak in complete sentences. NAD.

## 2021-11-07 NOTE — ED Provider Notes (Signed)
[facility] Emergency Department Provider Note  ____________________________________________   Event Date/Time   First MD Initiated Contact with Patient 11/07/21 1330     (approximate)  I have reviewed the triage vital signs and the nursing notes.   HISTORY  Chief Complaint Cough  HPI Amber Stevens is a 25 y.o. female presents to the ED with complaints of chest congestion, cough, headache.  She reports flulike symptoms for the last week.  She denies any frank fevers or chills.  She also denies any nausea, vomiting, dizziness.   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 Authorizing Provider  albuterol (VENTOLIN HFA) 108 (90 Base) MCG/ACT inhaler Inhale 2 puffs into the lungs every 6 (six) hours as needed for shortness of breath. 11/07/21  Yes Zaleah Ternes, Charlesetta Ivory, PA-C  predniSONE (DELTASONE) 20 MG tablet Take 1 tablet (20 mg total) by mouth 2 (two) times daily with a meal for 5 days. 11/07/21 11/12/21 Yes Jary Louvier, Charlesetta Ivory, PA-C  meloxicam (MOBIC) 15 MG tablet Take 1 tablet (15 mg total) by mouth daily. 05/09/21   Cuthriell, Delorise Royals, PA-C    Allergies Cherry flavor  Family History  Problem Relation Age of Onset   CAD Father    Kidney disease Father    Diabetes Father    Healthy Mother     Social History Social History   Tobacco Use   Smoking status: Every Day    Packs/day: 1.00    Years: 10.00    Pack years: 10.00    Types: Cigarettes   Smokeless tobacco: Never  Vaping Use   Vaping Use: Never used  Substance Use Topics    Alcohol use: No    Alcohol/week: 0.0 standard drinks   Drug use: Never    Review of Systems  Constitutional: No fever/chills Eyes: No visual changes. ENT: No sore throat. Cardiovascular: Denies chest pain. Respiratory: Denies shortness of breath.  Reports cough Gastrointestinal: No abdominal pain.  No nausea, no vomiting.  No diarrhea.  No constipation. Genitourinary: Negative for dysuria. Musculoskeletal: Negative for back pain. Skin: Negative for rash. Neurological: Negative for headaches, focal weakness or numbness. ____________________________________________   PHYSICAL EXAM:  VITAL SIGNS: ED Triage Vitals  Enc Vitals Group     BP 11/07/21 1146 (!) 146/89     Pulse Rate 11/07/21 1146 90     Resp 11/07/21 1146 19     Temp 11/07/21 1146 98.9 F (37.2 C)     Temp Source 11/07/21 1146 Oral     SpO2 11/07/21 1146 96 %     Weight --      Height --      Head Circumference --      Peak Flow --      Pain Score 11/07/21 1147 0     Pain Loc --      Pain Edu? --      Excl. in GC? --     Constitutional: Alert and oriented. Well appearing and in no acute distress. Eyes: Conjunctivae are normal. PERRL. EOMI.  Head: Atraumatic. Nose: No congestion/rhinnorhea. Mouth/Throat: Mucous membranes are moist.  Oropharynx non-erythematous. Neck: No stridor.   Cardiovascular: Normal rate, regular rhythm. Grossly normal heart sounds.  Good peripheral circulation. Respiratory: Normal respiratory effort.  No retractions. Lungs CTAB. Gastrointestinal: Soft and nontender. No distention. No abdominal bruits. No CVA tenderness. Musculoskeletal: No lower extremity tenderness nor edema.  No joint effusions. Neurologic:  Normal speech and language. No gross focal neurologic deficits are appreciated. No gait instability. Skin:  Skin is warm, dry and intact. No rash noted. Psychiatric: Mood and affect are normal. Speech and behavior are normal.  ____________________________________________    LABS (all labs ordered are listed, but only abnormal results are displayed)  Labs Reviewed  RESP PANEL BY RT-PCR (FLU A&B, COVID) [id number]   ____________________________________________  EKG  ____________________________________________  RADIOLOGY I, Lissa Hoard, personally viewed and evaluated these images (plain radiographs) as part of my medical decision making, as well as reviewing the written report by the radiologist.  ED MD interpretation:  agree with report  Official radiology report(s):  CXR  IMPRESSION: Peribronchial thickening suggests bronchitis. There is no focal pulmonary consolidation. There is no pleural effusion or pneumothorax.  ____________________________________________   PROCEDURES  Procedure(s) performed (including Critical Care):  Procedures   ____________________________________________   INITIAL IMPRESSION / ASSESSMENT AND PLAN / ED COURSE  As part of my medical decision making, I reviewed the following data within the electronic MEDICAL RECORD NUMBER Labs reviewed WNL, Radiograph reviewed as noted, and Notes from prior ED visits  DDX: Influenza, COVID, asthma exacerbation, bronchitis, viral URI  Patient ED evaluation of 1 week of cold and flulike symptoms with persistent cough and congestion.  She is evaluated for complaints in the ED, and found to have a negative viral panel screen.  Chest x-ray does show bronchitic changes consistent with likely viral etiology.  Patient will be discharged with prescriptions for prednisone and albuterol inhaler to use as directed.  She will follow-up with primary provider or eturn to the ED.   ____________________________________________   FINAL CLINICAL IMPRESSION(S) / ED DIAGNOSES  Final diagnoses:  Bronchitis     ED Discharge Orders          Ordered    albuterol (VENTOLIN HFA) 108 (90 Base) MCG/ACT inhaler  Every 6 hours PRN        11/07/21 1333    predniSONE (DELTASONE) 20 MG tablet  2  times daily with meals        11/07/21 1333             Note:  This document was prepared using Dragon voice recognition software and may include unintentional dictation errors.    Lissa Hoard, PA-C 11/08/21 1929    Arnaldo Natal, MD 11/12/21 [phone]

## 2022-04-28 ENCOUNTER — Other Ambulatory Visit: Payer: Self-pay

## 2022-04-28 ENCOUNTER — Ambulatory Visit
Admission: EM | Admit: 2022-04-28 | Discharge: 2022-04-28 | Disposition: A | Discharge status: Home / Self Care | Payer: Self-pay | Attending: Emergency Medicine | Admitting: Emergency Medicine

## 2022-04-28 DIAGNOSIS — R062 Wheezing: Secondary | ICD-10-CM

## 2022-04-28 DIAGNOSIS — L0882 Omphalitis not of newborn: Secondary | ICD-10-CM

## 2022-04-28 MED ORDER — PREDNISONE 20 MG PO TABS: 40.0000 mg | ORAL_TABLET | Freq: Every day | ORAL | 0 refills | Status: AC

## 2022-04-28 MED ORDER — DOXYCYCLINE HYCLATE 100 MG PO CAPS: 100.0000 mg | ORAL_CAPSULE | Freq: Two times a day (BID) | ORAL | 0 refills | Status: AC

## 2022-04-28 MED ORDER — ALBUTEROL SULFATE HFA 108 (90 BASE) MCG/ACT IN AERS: 2.0000 | INHALATION_SPRAY | Freq: Four times a day (QID) | RESPIRATORY_TRACT | 0 refills | Status: AC | PRN

## 2022-04-28 NOTE — Discharge Instructions (Addendum)
You may look for coupons on [employer] for your medications ? ?For your lungs ? ?I was able to hear wheezing on exam which is most likely the cause of your chest tightness, burning and prednisone every morning with food for the next 5 days, to reduce inflammation ? ?If you are able to afford your inhaler, you may take 2 puffs every 6 hours as needed for wheezing or shortness of breath ? ?For worsening symptoms you may follow-up at the urgent care as needed ? ?For your bellybutton ? ?COVID can coverage for bacterial infection, this may or may not be the cause of your recent fevers ? ?Take doxycycline twice a day for the next 7 days ? ?Avoid use of peroxide or alcohol when cleansing area as this will cause dryness to the wound bed and prolonged healing ? ?You may cleanse daily with your normal hygiene using diluted soapy water, pat dry, may leave open to air ? ?For worsening or persistent symptoms you may follow-up with urgent care as needed ? ? ? ?

## 2022-04-28 NOTE — ED Triage Notes (Addendum)
Pt states she started with fever, sweating, and tightness of chest starting yesterday. Chest tightness with deep breaths. Pt also reports infected belly button ?

## 2022-04-28 NOTE — ED Provider Notes (Signed)
?[facility] ? ? ? ?CSN: [id number] ?Arrival date & time: 04/28/22  1148 ? ? ?  ? ?History   ?Chief Complaint ?Chief Complaint  ?Patient presents with  ? Fever  ? Skin Infection  ? ? ?HPI ?Amber Stevens is a 26 y.o. female.  ? ?Patient presents with fever, diaphoresis and centralized chest tightness for 2 days.  Tolerating food and liquids.  No known sick contacts.  History of asthma, no current medication use.  Daily tobacco use.  Denies shortness of breath, wheezing, congestion, ear pain, headaches, sore throat.  Has not attempted treatment of symptoms. ? ?Patient concerned with erythema and a burning sensation noted to her bellybutton 1 day ago.  Endorses that she scratched the area and symptoms started shortly after.  Attempted to clean with peroxide and Vaseline. ? ?Past Medical History:  ?Diagnosis Date  ? Asthma   ? Obesity   ? ? ?Patient Active Problem List  ? Diagnosis Date Noted  ? Antisocial personality disorder (HCC) 10/22/2015  ? 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 ? ?Past Surgical History:  ?Procedure Laterality Date  ? NO PAST SURGERIES    ? ? ?OB History   ?No obstetric history on file. ?  ? ? ? ?Home Medications   ? ?Prior to Admission medications   ?Medication Sig Start Date End Date Taking? Authorizing Provider  ?albuterol (VENTOLIN HFA) 108 (90 Base) MCG/ACT inhaler Inhale 2 puffs into the lungs every 6 (six) hours as needed for shortness of breath. 11/07/21   Menshew, Charlesetta IvoryJenise V Bacon, PA-C  ?meloxicam (MOBIC) 15 MG tablet Take 1 tablet (15 mg total) by mouth daily. 05/09/21   Cuthriell, Delorise RoyalsJonathan D, PA-C  ? ? ?Family History ?Family History  ?Problem Relation Age of Onset  ? CAD Father   ? Kidney disease Father   ? Diabetes Father   ? Healthy Mother   ? ? ?Social History ?Social History  ? ?Tobacco Use  ? Smoking status: Every Day  ?  Packs/day: 1.50  ?  Years: 10.00  ?  Pack years: 15.00  ?  Types:  Cigarettes  ? Smokeless tobacco: Never  ?Vaping Use  ? Vaping Use: Never used  ?Substance Use Topics  ? Alcohol use: No  ?  Alcohol/week: 0.0 standard drinks  ? Drug use: Never  ? ? ? ?Allergies   ?Cherry flavor ? ? ?Review of Systems ?Review of Systems  ?Constitutional:  Positive for fever. Negative for activity change, appetite change, chills, diaphoresis, fatigue and unexpected weight change.  ?HENT:  Positive for rhinorrhea. Negative for congestion, dental problem, drooling, ear discharge, ear pain, facial swelling, hearing loss, mouth sores, nosebleeds, postnasal drip, sinus pressure, sinus pain, sneezing, sore throat, tinnitus, trouble swallowing and voice change.   ?Respiratory:  Negative for apnea, cough, choking, chest tightness, shortness of breath, wheezing and stridor.   ?Cardiovascular: Negative.   ?Gastrointestinal: Negative.   ?Skin:  Positive for wound. Negative for color change, pallor and rash.  ? ? ?Physical Exam ?Triage Vital Signs ?ED Triage Vitals  ?Enc Vitals Group  ?   BP 04/28/22 1200 116/87  ?   Pulse Rate 04/28/22 1200 92  ?   Resp 04/28/22 1200 20  ?   Temp 04/28/22 1200 98.8 ?F (37.1 ?C)  ?   Temp Source 04/28/22 1200 Oral  ?   SpO2 04/28/22 1200 97 %  ?   Weight 04/28/22 1156 288  lb (130.6 kg)  ?   Height 04/28/22 1156 5\' 3"  (1.6 m)  ?   Head Circumference --   ?   Peak Flow --   ?   Pain Score 04/28/22 1156 5  ?   Pain Loc --   ?   Pain Edu? --   ?   Excl. in GC? --   ? ?No data found. ? ?Updated Vital Signs ?BP 116/87 (BP Location: Left Arm)   Pulse 92   Temp 98.8 ?F (37.1 ?C) (Oral)   Resp 20   Ht 5\' 3"  (1.6 m)   Wt 288 lb (130.6 kg)   LMP 04/01/2022   SpO2 97%   BMI 51.02 kg/m?  ? ?Visual Acuity ?Right Eye Distance:   ?Left Eye Distance:   ?Bilateral Distance:   ? ?Right Eye Near:   ?Left Eye Near:    ?Bilateral Near:    ? ?Physical Exam ?Constitutional:   ?   Appearance: Normal appearance.  ?HENT:  ?   Head: Normocephalic.  ?   Right Ear: Tympanic membrane, ear canal and  external ear normal.  ?   Left Ear: Tympanic membrane, ear canal and external ear normal.  ?   Nose: Nose normal.  ?   Mouth/Throat:  ?   Mouth: Mucous membranes are moist.  ?   Pharynx: Oropharynx is clear.  ?Eyes:  ?   Extraocular Movements: Extraocular movements intact.  ?Cardiovascular:  ?   Rate and Rhythm: Normal rate.  ?   Pulses: Normal pulses.  ?   Heart sounds: Normal heart sounds.  ?Pulmonary:  ?   Effort: Pulmonary effort is normal.  ?   Breath sounds: Wheezing present.  ?Abdominal:  ?   Comments: Mild erythema with Vannie Hilgert to yellow drainage noted at the umbilicus, tender to touch  ?Musculoskeletal:  ?   Cervical back: Normal range of motion and neck supple.  ?Neurological:  ?   Mental Status: She is alert and oriented to person, place, and time. Mental status is at baseline.  ?Psychiatric:     ?   Mood and Affect: Mood normal.     ?   Behavior: Behavior normal.  ? ? ? ?UC Treatments / Results  ?Labs ?(all labs ordered are listed, but only abnormal results are displayed) ?Labs Reviewed - No data to display ? ?EKG ? ? ?Radiology ?No results found. ? ?Procedures ?Procedures (including critical care time) ? ?Medications Ordered in UC ?Medications - No data to display ? ?Initial Impression / Assessment and Plan / UC Course  ?I have reviewed the triage vital signs and the nursing notes. ? ?Pertinent labs & imaging results that were available during my care of the patient were reviewed by me and considered in my medical decision making (see chart for details). ? ?Omphalitis in adult ?Wheezing ? ?Vital signs are stable, O2 saturation 97% on room air, wheezing heard to auscultation most likely the cause of chest tightness, discussed with patient, prednisone 40 mg burst sent to pharmacy, albuterol inhaler sent to pharmacy however patient endorses due to finances she may not be able to afford as she does not have insurance coverage, given information for good , umbilicus appears to be mildly infected, will  start antibiotic course for coverage, doxycycline 7-day course prescribed, recommended cleansing with daily hygiene using diluted soapy water and patting dry, may leave open to air, may follow-up with the urgent care as needed for persisting or reoccurring symptoms ?Final Clinical Impressions(s) / UC  Diagnoses  ? ?Final diagnoses:  ?None  ? ?Discharge Instructions   ?None ?  ? ?ED Prescriptions   ?None ?  ? ?PDMP not reviewed this encounter. ?  ?Valinda Hoar, NP ?04/28/22 1238 ? ?

## 2022-06-21 IMAGING — CR DG CHEST 2V
1 series · 2 of 2 positions shown · non-contrast
Comparison: None.

CLINICAL DATA: Cough chest congestion

EXAM:
CHEST - 2 VIEW

[Series 1: w chest pa · 0.14mm/px · 2 of 2 slices shown]
[im 1/2]
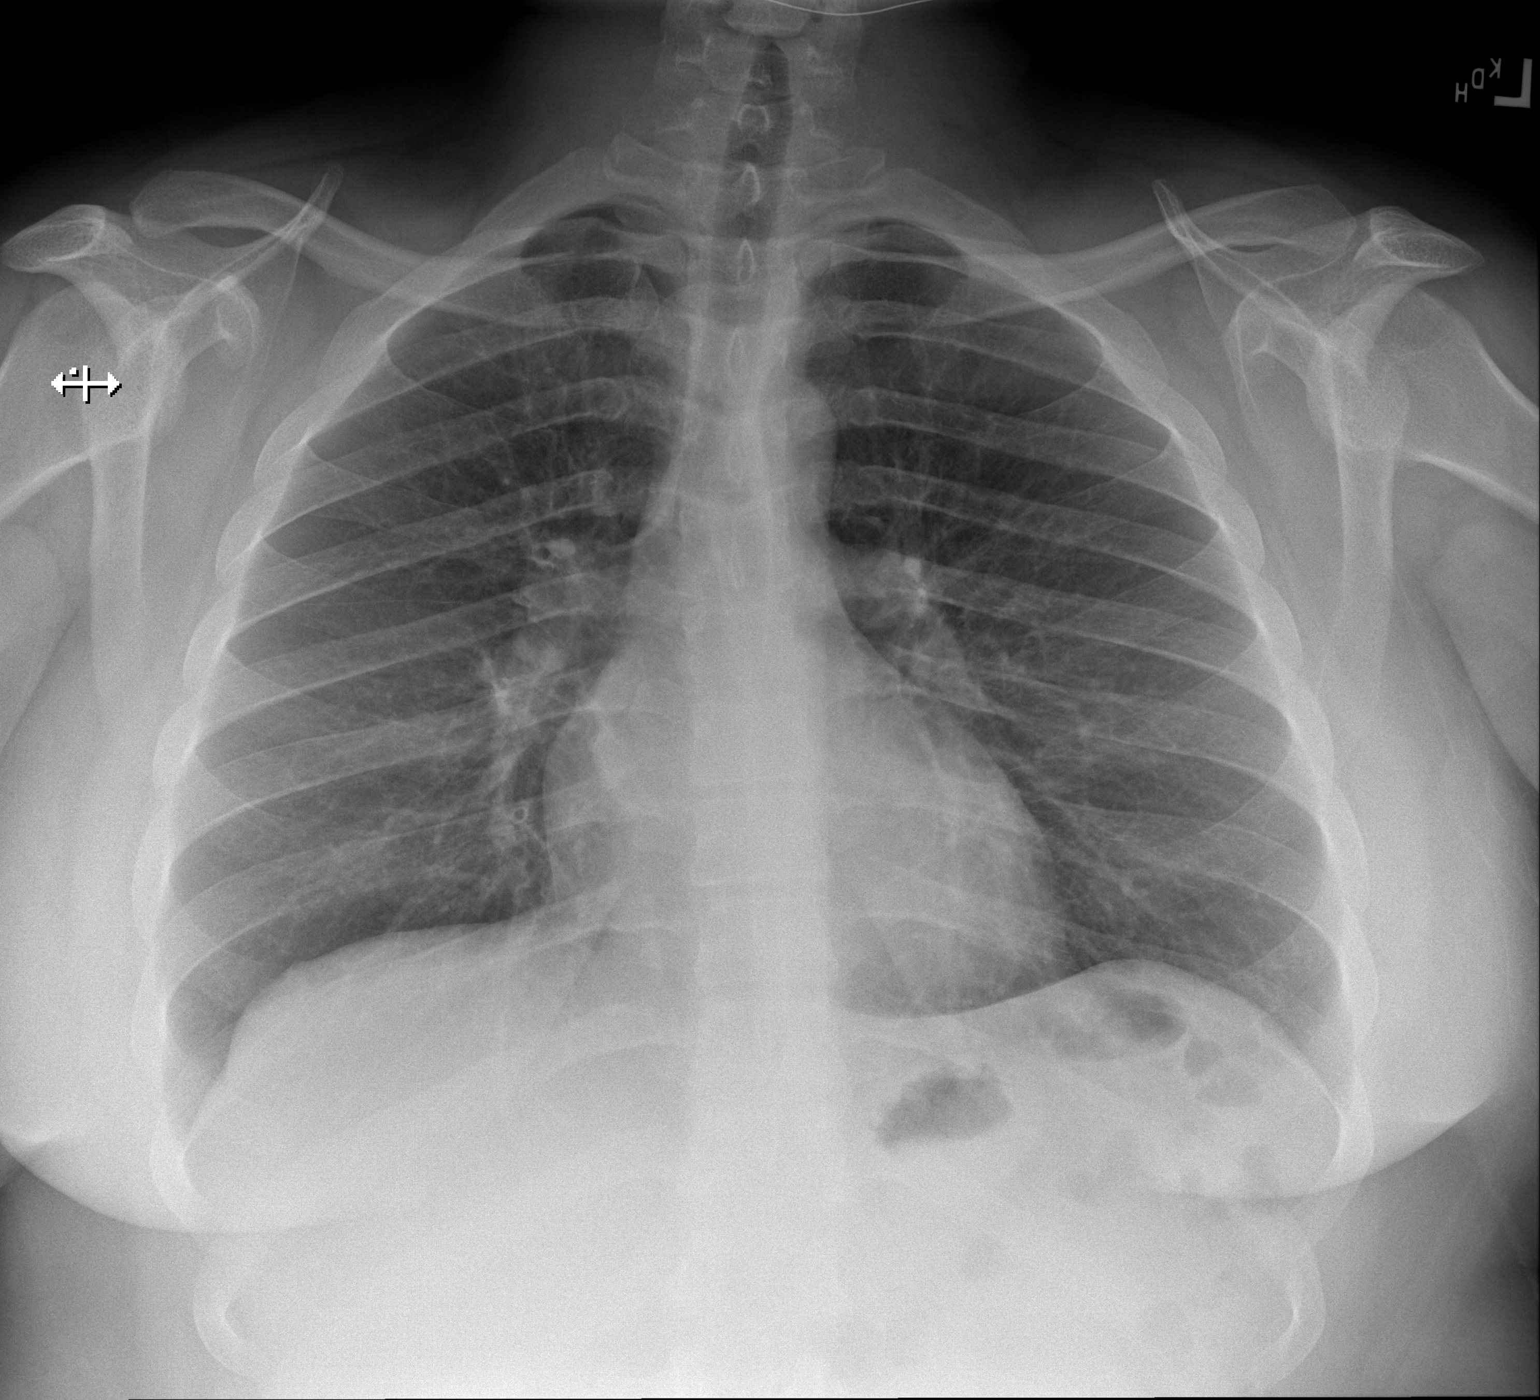
[im 2/2]
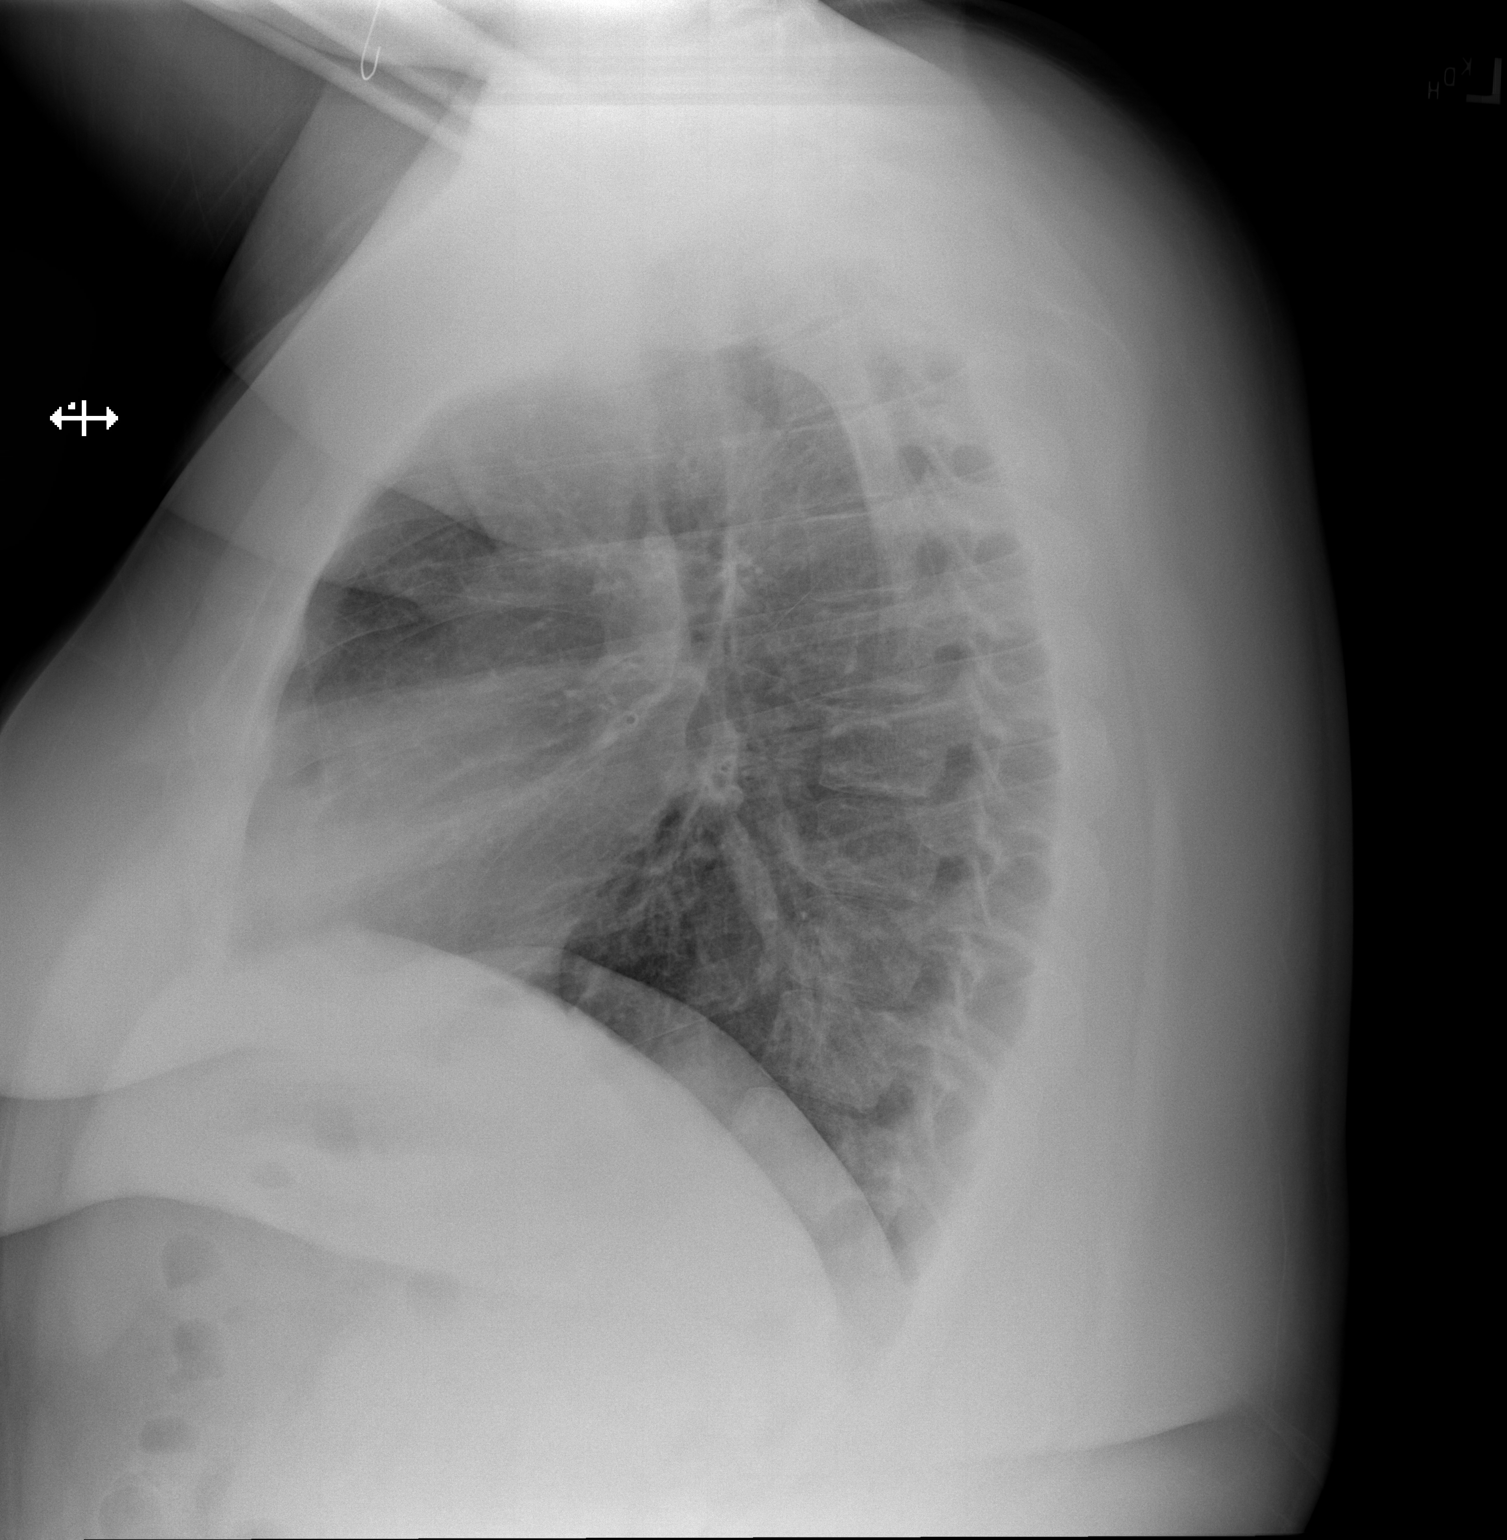

[2 of 2 positions shown; findings below may reference images not displayed]

FINDINGS: Cardiac size is within normal limits. There are no signs of
pulmonary edema or focal pulmonary consolidation. There is mild
peribronchial thickening. There is no pleural effusion or
pneumothorax.
IMPRESSION: Peribronchial thickening suggests bronchitis. There is no focal
pulmonary consolidation. There is no pleural effusion or
pneumothorax.
